# Patient Record
Sex: Female | Born: 2005 | Race: White | Hispanic: No | Marital: Single | Smoking: Never smoker
Health system: Southern US, Community
[De-identification: ages and names within clinical notes are randomized; demographics above are authoritative.]

## PROBLEM LIST (undated history)

## (undated) DIAGNOSIS — J45990 Exercise induced bronchospasm: Secondary | ICD-10-CM

## (undated) DIAGNOSIS — N83201 Unspecified ovarian cyst, right side: Secondary | ICD-10-CM

## (undated) DIAGNOSIS — N926 Irregular menstruation, unspecified: Secondary | ICD-10-CM

## (undated) HISTORY — DX: Unspecified ovarian cyst, right side: N83.201

## (undated) HISTORY — DX: Irregular menstruation, unspecified: N92.6

## (undated) HISTORY — PX: NO PAST SURGERIES: SHX2092

---
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
## 2023-01-19 ENCOUNTER — Ambulatory Visit (INDEPENDENT_AMBULATORY_CARE_PROVIDER_SITE_OTHER): Payer: Self-pay | Admitting: Clinical

## 2023-01-19 DIAGNOSIS — F411 Generalized anxiety disorder: Secondary | ICD-10-CM

## 2023-01-19 NOTE — Progress Notes (Addendum)
Time: 11:00 am- 11:58 am Diagnosis: F41.1 CPT Code: 16109U-04  Sabrina Harrington was seen remotely using secure video conferencing. They joined from their significant other's home at [address], [place], [place] and therapist was in her office.  Session focused on exploring Sabrina Harrington's perception that they are in survival mode. Therapist encouraged them to consider what it might look like to not be in survival mode, and suggested reflecting upon this, as well as incorporating opportunities for playfulness and enjoyment when possible. They are scheduled to be seen again in two weeks.  Treatment Plan Client Abilities/Strengths  Sabrina Harrington was able to insightfully describe emotional experiences.  Client Treatment Preferences  Sabrina Harrington is flexible in terms of appointment times, and prefers in person appointments.  Client Statement of Needs  Sabrina Harrington is seeking CBT to address symptoms of anxiety.  Treatment Level  Biweekly  Symptoms  Anxiety: panic attacks consisting of hyperventiliation, sensitivity to sound, clammy hands, tearfulness. Persistent difficulty relaxing across a variety of situations, tingling in limbs and spine (Status: maintained).  Problems Addressed  New Description  Goals 1. Sabrina Harrington's anxiety sometimes interferes with their ability to complete day-to-day tasks Objective Sabrina Harrington would like to develop strategies to regulate their anxiety  Target Date: 2023-12-30 Frequency: Biweekly  Progress: 0 Modality: individual  Related Interventions Therapist will incorporate manualized treatment as appropriate Therapist will provide referrals for additional resources as appropriate Therapist will provide opportunities to process experiences in session Therapist will help Sabrina Harrington to notice and disengage from maladaptive thoughts and behaviors Therapist will work with Sabrina Harrington to identify emotion regulation strategies including meditation, mindfulness, and self-care Diagnosis Axis none 300.02 (Generalized anxiety disorder) -  Open - [Signifier: n/a]    Conditions For Discharge Achievement of treatment goals and objectives   Chrissie Noa, PhD              Chrissie Noa, PhD

## 2023-03-01 ENCOUNTER — Ambulatory Visit: Payer: Self-pay | Admitting: Clinical

## 2023-03-22 ENCOUNTER — Ambulatory Visit: Payer: Self-pay | Admitting: Clinical

## 2023-05-04 ENCOUNTER — Ambulatory Visit
Admission: EM | Admit: 2023-05-04 | Discharge: 2023-05-04 | Disposition: A | Discharge status: Home / Self Care | Payer: Self-pay

## 2023-05-04 NOTE — ED Triage Notes (Signed)
Patient presents to UC for work note. States she missed work today due to cold like symptoms.

## 2023-06-12 IMAGING — US US EXTREM LOW*R* LIMITED
1 series · 10 of 10 positions shown · non-contrast
Comparison: None.

CLINICAL DATA: Initial evaluation for palpable mass at right
posterior calf for 1.5 years.

EXAM:
ULTRASOUND RIGHT LOWER EXTREMITY LIMITED
TECHNIQUE: Ultrasound examination of the lower extremity soft tissues was
performed in the area of clinical concern.

[Series 1: us soft tissue lower extremity limited right (non- · 10 acquisitions, 10 frames shown]
[im 1/10]
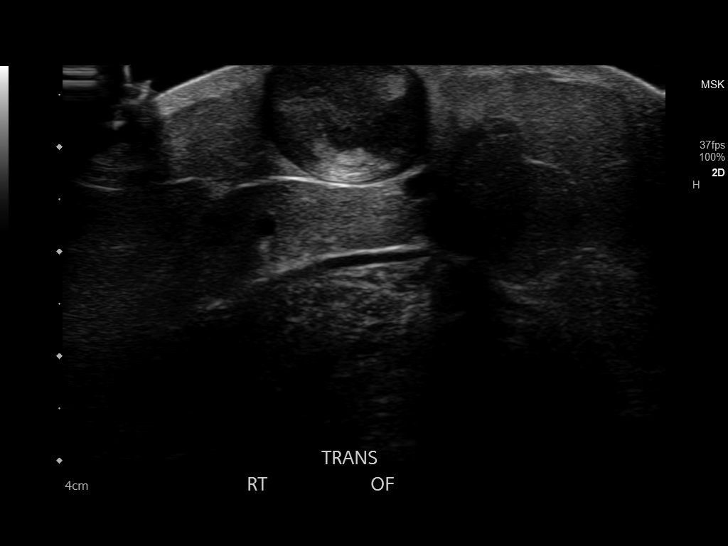
[im 2/10]
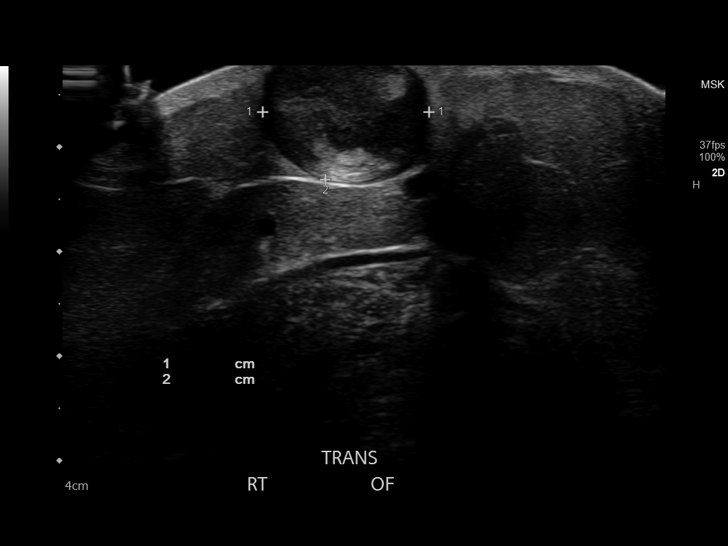
[im 3/10]
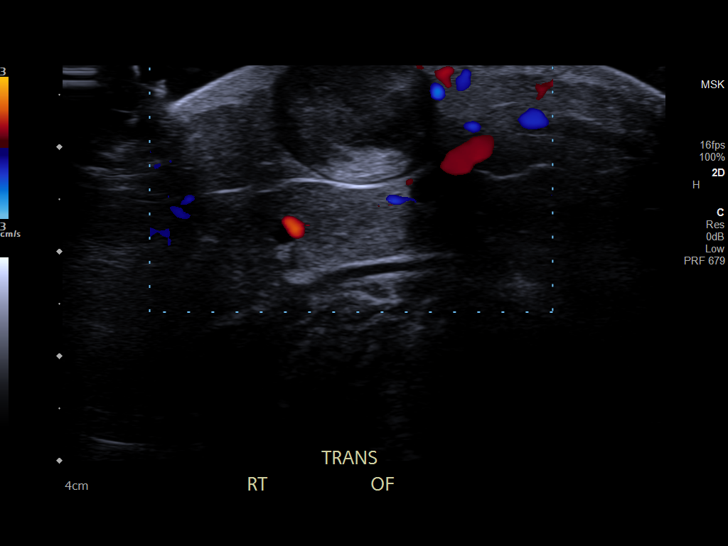
[im 4/10]
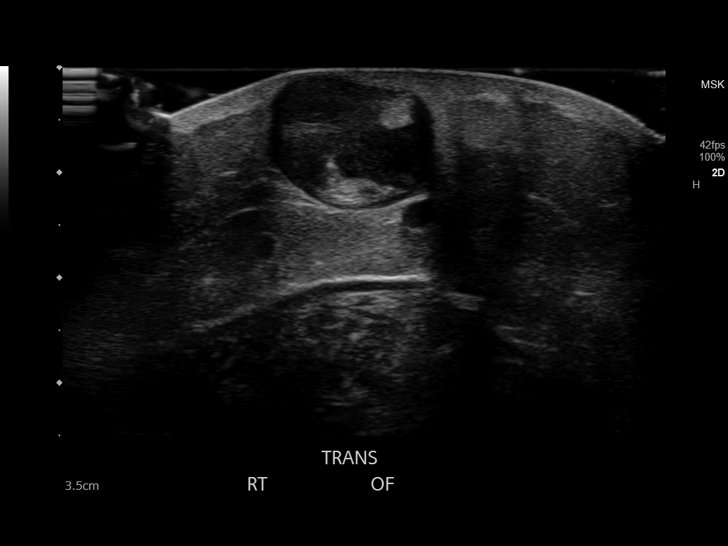
[im 5/10]
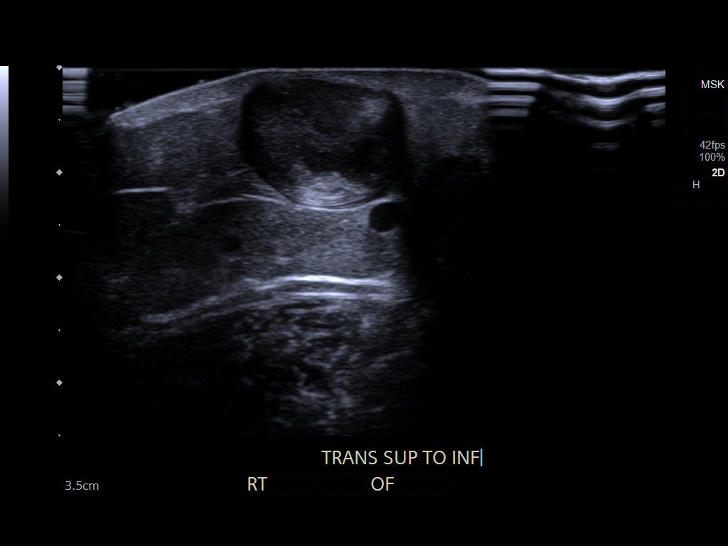
[im 6/10]
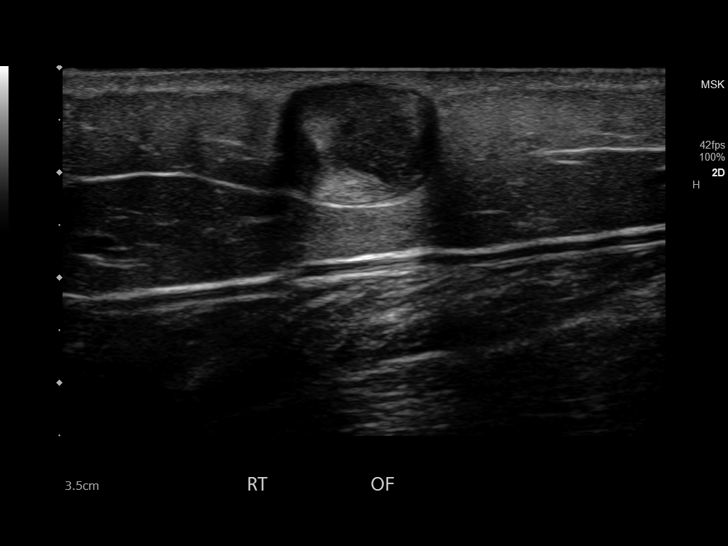
[im 7/10]
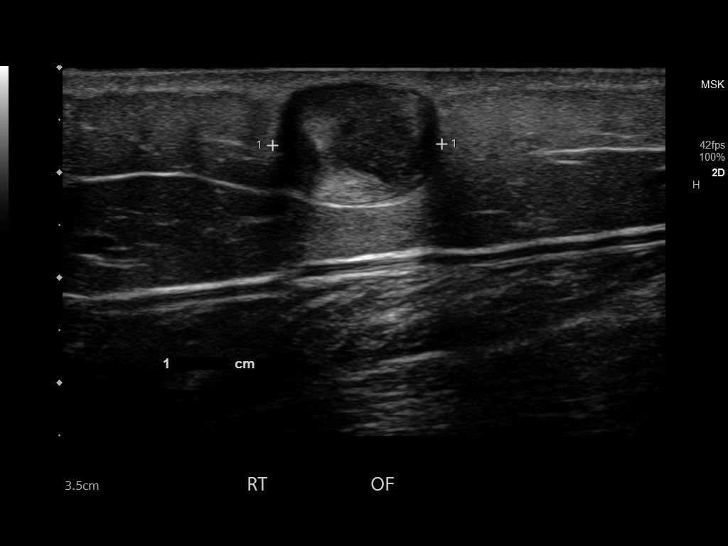
[im 8/10]
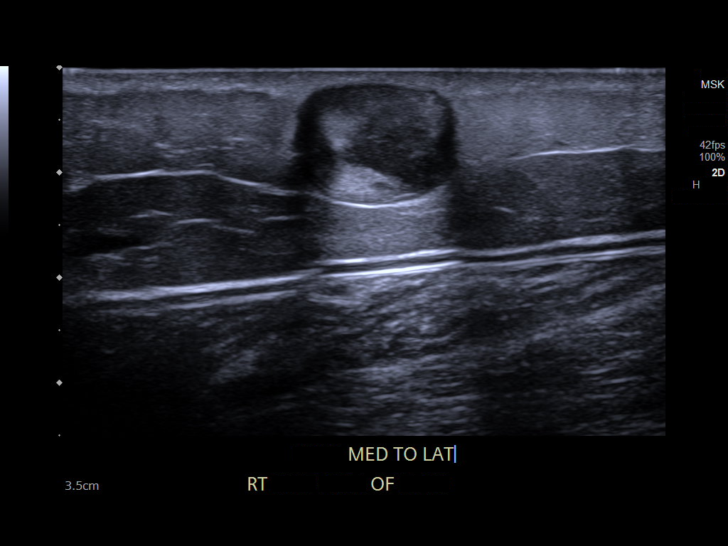
[im 9/10]
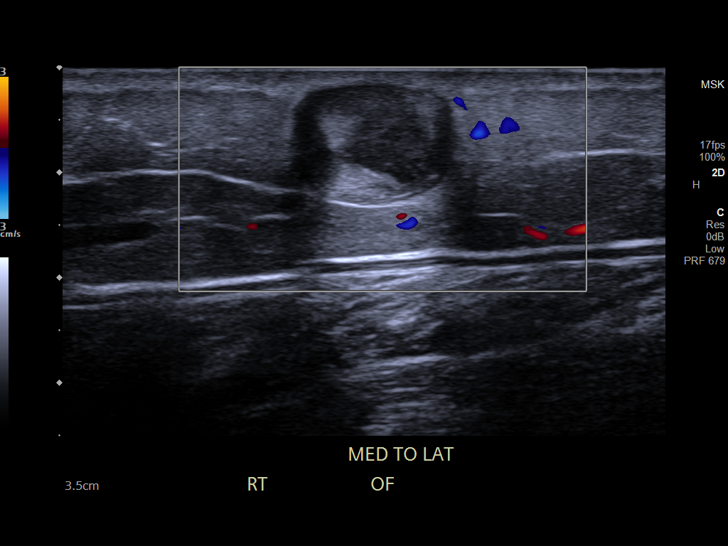
[im 10/10]
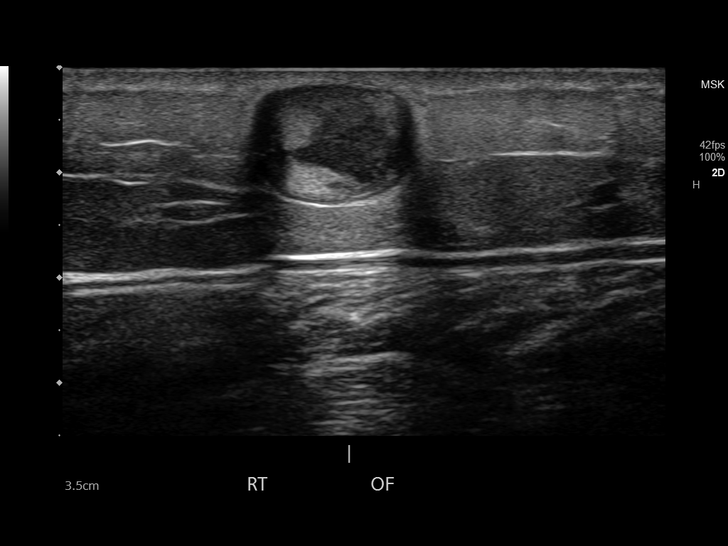

[10 of 10 positions shown; findings below may reference images not displayed]

FINDINGS: Targeted ultrasound of a palpable abnormality of concern at the
right posterior calf was performed. Ultrasound demonstrates a
well-circumscribed complex ovoid lesion measuring 1.6 x 1.2 x
cm. Lesion demonstrates mixed echogenicity but is predominantly
hypoechoic in nature. Abnormality is positioned just deep to the
skin, primarily involving the subcutaneous fat. Lesion mildly
compresses but does not appear to involve the underlying
musculature. No associated vascularity with Doppler interrogation.
Finding is indeterminate.

Remainder of the visualized surrounding fibromuscular soft tissues
are otherwise unremarkable.
IMPRESSION: 1.6 x 1.2 x 1.6 cm complex cystic lesion within the subcutaneous fat
of the right posterior calf. Finding demonstrates indeterminate
features by ultrasound, but could reflect a complex sebaceous cyst.
Ultrasound-guided FNA and histologic sampling would likely be
helpful for further evaluation. Alternatively, further evaluation
with dedicated MRI, with and without contrast, could be performed
for further evaluation as warranted.

## 2024-06-16 ENCOUNTER — Other Ambulatory Visit: Payer: Self-pay

## 2024-06-16 ENCOUNTER — Emergency Department
Admission: EM | Admit: 2024-06-16 | Discharge: 2024-06-16 | Disposition: A | Discharge status: Home / Self Care | Payer: Self-pay | Attending: Emergency Medicine | Admitting: Emergency Medicine

## 2024-06-16 DIAGNOSIS — N10 Acute tubulo-interstitial nephritis: Secondary | ICD-10-CM | POA: Insufficient documentation

## 2024-06-16 LAB — URINALYSIS, ROUTINE W REFLEX MICROSCOPIC
Bacteria, UA: NONE SEEN
Bilirubin Urine: NEGATIVE
Glucose, UA: NEGATIVE mg/dL
Hgb urine dipstick: NEGATIVE
Ketones, ur: NEGATIVE mg/dL
Nitrite: NEGATIVE
Protein, ur: 30 mg/dL — AB
Specific Gravity, Urine: 1.023 (ref 1.005–1.030)
WBC, UA: >50 WBC/hpf (ref 0–5)
pH: 6 (ref 5.0–8.0)

## 2024-06-16 LAB — CBC
HCT: 39.1 % (ref 36.0–46.0)
Hemoglobin: 12.5 g/dL (ref 12.0–15.0)
MCH: 27.8 pg (ref 26.0–34.0)
MCHC: 32 g/dL (ref 30.0–36.0)
MCV: 86.9 fL (ref 80.0–100.0)
Platelets: 277 K/uL (ref 150–400)
RBC: 4.5 MIL/uL (ref 3.87–5.11)
RDW: 12.5 % (ref 11.5–15.5)
WBC: 4.7 K/uL (ref 4.0–10.5)
nRBC: 0 % (ref 0.0–0.2)

## 2024-06-16 LAB — COMPREHENSIVE METABOLIC PANEL WITH GFR
ALT: 19 U/L (ref 0–44)
AST: 19 U/L (ref 15–41)
Albumin: 4.6 g/dL (ref 3.5–5.0)
Alkaline Phosphatase: 58 U/L (ref 38–126)
Anion gap: 10 (ref 5–15)
BUN: 14 mg/dL (ref 6–20)
CO2: 24 mmol/L (ref 22–32)
Calcium: 9.5 mg/dL (ref 8.9–10.3)
Chloride: 104 mmol/L (ref 98–111)
Creatinine, Ser: 0.73 mg/dL (ref 0.44–1.00)
GFR, Estimated: >60 mL/min (ref >60)
Glucose, Bld: 90 mg/dL (ref 70–99)
Potassium: 3.9 mmol/L (ref 3.5–5.1)
Sodium: 138 mmol/L (ref 135–145)
Total Bilirubin: 0.6 mg/dL (ref 0.0–1.2)
Total Protein: 7.9 g/dL (ref 6.5–8.1)

## 2024-06-16 LAB — POC URINE PREG, ED: Preg Test, Ur: NEGATIVE

## 2024-06-16 LAB — LIPASE, BLOOD: Lipase: 24 U/L (ref 11–51)

## 2024-06-16 MED ORDER — CEFDINIR 300 MG PO CAPS: 300.0000 mg | ORAL_CAPSULE | Freq: Two times a day (BID) | ORAL | 0 refills | Status: AC

## 2024-06-16 NOTE — Discharge Instructions (Addendum)
 You were seen in the emergency department today for evaluation of your urine symptoms and flank pain.  Your testing did show a likely urine infection with your flank pain this may be starting to affect your kidneys.  Your lab work was fortunately overall reassuring without evidence of sepsis.  I sent a prescription for antibiotics to your pharmacy.  Please take these as directed.  Follow-up your primary care doctor for further evaluation.  Return to the ER for new or worsening symptoms.

## 2024-06-16 NOTE — ED Triage Notes (Signed)
 Pt to ED via POV from home. Pt reports pain with urination and right lower back pain that started yesterday around 6pm. Pt denies hx of kidney stones.

## 2024-06-16 NOTE — ED Provider Notes (Signed)
 [facility] Provider Note    Event Date/Time   First MD Initiated Contact with Patient 06/16/24 1423     (approximate)   History   Back Pain   HPI  Misha Vanoverbeke is a 18 year old presenting to the emergency department for evaluation of dysuria and flank pain.  For the past several days patient has had dysuria and urinary frequency.  Has been using over-the-counter Azo.  Yesterday had onset of dull right sided lower back pain.  No sharp or intermittent pain.  No noted hematuria but patient is on their menstrual cycle.  Spoke with a friend who was concerned about a possible kidney infection and recommended presentation to the ER.  No fevers.     Physical Exam   Triage Vital Signs: ED Triage Vitals  Encounter Vitals Group     BP 06/16/24 1325 (!) 130/91     Girls Systolic BP Percentile --      Girls Diastolic BP Percentile --      Boys Systolic BP Percentile --      Boys Diastolic BP Percentile --      Pulse Rate 06/16/24 1325 74     Resp 06/16/24 1325 18     Temp 06/16/24 1325 98.4 F (36.9 C)     Temp Source 06/16/24 1325 Oral     SpO2 06/16/24 1325 98 %     Weight --      Height --      Head Circumference --      Peak Flow --      Pain Score 06/16/24 1324 2     Pain Loc --      Pain Education --      Exclude from Growth Chart --     Most recent vital signs: Vitals:   06/16/24 1429 06/16/24 1439  BP:    Pulse:    Resp:    Temp:    SpO2: 98% 98%     General: Awake, interactive  CV:  Good peripheral perfusion Resp:  Unlabored respirations Abd:  Nondistended, soft, no significant tenderness to palpation, no CVA tenderness Neuro:  Symmetric facial movement, fluid speech   ED Results / Procedures / Treatments   Labs (all labs ordered are listed, but only abnormal results are displayed) Labs Reviewed  URINALYSIS, ROUTINE W REFLEX MICROSCOPIC - Abnormal; Notable for the following components:      Result Value   Color, Urine  YELLOW (*)    APPearance CLOUDY (*)    Protein, ur 30 (*)    Leukocytes,Ua LARGE (*)    All other components within normal limits  URINE CULTURE  LIPASE, BLOOD  COMPREHENSIVE METABOLIC PANEL WITH GFR  CBC  POC URINE PREG, ED     EKG EKG independently reviewed and interpreted by myself demonstrates:    RADIOLOGY Imaging independently reviewed and interpreted by myself demonstrates:   Formal Radiology Read:  No results found.  PROCEDURES:  Critical Care performed: No  Procedures   MEDICATIONS ORDERED IN ED: Medications - No data to display   IMPRESSION / MDM / ASSESSMENT AND PLAN / ED COURSE  I reviewed the triage vital signs and the nursing notes.  Differential diagnosis includes, but is not limited to, UTI, pyelonephritis, much lower suspicion for renal stone based on clinical history, very low suspicion other acute intra-abdominal process given reassuring abdominal exam.  Patient's presentation is most consistent with acute complicated illness / injury requiring diagnostic workup.  18 year old presenting with dysuria  and flank pain.  Stable vitals on presentation.  Labs with reassuring CBC including normal white blood cell count, CMP including normal renal function, normal lipase.  UPT negative.  Urine is concerning for infection with greater than 50 white blood cells and large leukocyte esterase.  Small amount of red blood cells but no significant hematuria.  Clinical history and labs overall less suggestive of renal stone.  Did consider and discussed CT scan with patient to further evaluate, but they prefer to hold off on CT and empirically treat for UTI with possible early pyelonephritis without evidence of sepsis.  With low clinical suspicion for renal stone, do think this is reasonable.  Strict return precautions provided.  Patient discharged in stable condition with prescription for cefdinir.      FINAL CLINICAL IMPRESSION(S) / ED DIAGNOSES   Final diagnoses:   Acute pyelonephritis     Rx / DC Orders   ED Discharge Orders          Ordered    cefdinir (OMNICEF) 300 MG capsule  2 times daily        06/16/24 1438             Note:  This document was prepared using Dragon voice recognition software and may include unintentional dictation errors.   Levander Slate, MD 06/16/24 1440

## 2024-06-17 LAB — URINE CULTURE

## 2024-09-12 ENCOUNTER — Ambulatory Visit
Admission: EM | Admit: 2024-09-12 | Discharge: 2024-09-12 | Disposition: A | Discharge status: Home / Self Care | Payer: Self-pay | Attending: Emergency Medicine | Admitting: Emergency Medicine

## 2024-09-12 DIAGNOSIS — R300 Dysuria: Secondary | ICD-10-CM | POA: Insufficient documentation

## 2024-09-12 DIAGNOSIS — R112 Nausea with vomiting, unspecified: Secondary | ICD-10-CM | POA: Insufficient documentation

## 2024-09-12 LAB — POCT URINE DIPSTICK
Bilirubin, UA: NEGATIVE
Blood, UA: NEGATIVE
Glucose, UA: NEGATIVE mg/dL
Nitrite, UA: NEGATIVE
POC PROTEIN,UA: NEGATIVE
Spec Grav, UA: 1.025
Urobilinogen, UA: 2 U/dL — AB
pH, UA: 5.5

## 2024-09-12 LAB — POCT URINE PREGNANCY: Preg Test, Ur: NEGATIVE

## 2024-09-12 MED ORDER — CEPHALEXIN 500 MG PO CAPS: 500.0000 mg | ORAL_CAPSULE | Freq: Three times a day (TID) | ORAL | 0 refills | Status: AC

## 2024-09-12 MED ORDER — ONDANSETRON 4 MG PO TBDP: 4.0000 mg | ORAL_TABLET | Freq: Three times a day (TID) | ORAL | 0 refills | Status: AC | PRN

## 2024-09-12 NOTE — Discharge Instructions (Addendum)
 Take the antibiotic as directed.  The urine culture is pending.  We will call you if it shows the need to change or discontinue your antibiotic.    Take the antinausea medication as directed.  Keep yourself hydrated with clear liquids, such as water.  Advance your diet as tolerated.   Go to the emergency department if you have worsening symptoms.    Follow up with your primary care provider tomorrow.

## 2024-09-12 NOTE — ED Triage Notes (Signed)
 Vomiting, nausea, chills, congestion x 1 day. Urinary frequency, burning with urination x 4 days.

## 2024-09-12 NOTE — ED Notes (Signed)
 Attempted to call patient back 2 times no response. Placed back on wait list. Patient came back at 4:50pm

## 2024-09-12 NOTE — ED Provider Notes (Signed)
 " [facility]    CSN: [id number] Arrival date & time: 09/12/24  1453      History   Chief Complaint Chief Complaint  Patient presents with   Nausea   Emesis    HPI Sabrina Harrington is a 19 y.o. adult.   Patient presents with 1 day history of chills, congestion, nausea, vomiting.  No emesis today; 3 episodes yesterday.  Also presents with dysuria and urinary frequency x 4 days.  No fever, cough, shortness of breath, abdominal pain, hematuria, flank pain, vaginal discharge, pelvic pain.  No OTC medications taken today.  Patient has a history of acute pyelonephritis seen at [facility] on 06/16/2024; treated with cefdinir .  The history is provided by the patient and medical records.    Past Medical History:  Diagnosis Date   Bilateral ovarian cysts    Irregular periods/menstrual cycles     Patient Active Problem List   Diagnosis Date Noted   PCOS (polycystic ovarian syndrome) 09/19/2022   Nasal pain 09/19/2022   Preventative health care 10/20/2021   Mass of right lower extremity 10/20/2021   Anxiety 10/20/2021   Menstrual irregularity 10/20/2021   Marijuana smoker 10/20/2021    Past Surgical History:  Procedure Laterality Date   NO PAST SURGERIES      OB History   No obstetric history on file.      Home Medications    Prior to Admission medications  Medication Sig Start Date End Date Taking? Authorizing Provider  cephALEXin  (KEFLEX ) 500 MG capsule Take 1 capsule (500 mg total) by mouth 3 (three) times daily for 5 days. 09/12/24 09/17/24 Yes Corlis Burnard DEL, NP  ondansetron  (ZOFRAN -ODT) 4 MG disintegrating tablet Take 1 tablet (4 mg total) by mouth every 8 (eight) hours as needed for nausea or vomiting. 09/12/24  Yes Corlis Burnard DEL, NP  ibuprofen  (ADVIL ) 600 MG tablet Take 1 tablet (600 mg total) by mouth every 6 (six) hours as needed. Patient not taking: Reported on 09/19/2022 01/12/22   Van Knee, MD  meclizine  (ANTIVERT ) 25 MG tablet Take 1 tablet  (25 mg total) by mouth 3 (three) times daily as needed for dizziness. Patient not taking: Reported on 09/19/2022 01/12/22   Van Knee, MD    Family History Family History  Problem Relation Age of Onset   Hyperlipidemia Mother    Arthritis Mother    Hypertension Father    Diabetes Father    Hyperlipidemia Father    Cancer Maternal Aunt 27       breast cancer   Hypertension Maternal Grandfather    Diabetes Maternal Grandfather    Diabetes Paternal Grandfather     Social History Social History[1]   Allergies   Pineapple   Review of Systems Review of Systems  Constitutional:  Positive for chills. Negative for fever.  HENT:  Positive for congestion. Negative for ear pain and sore throat.   Respiratory:  Negative for cough and shortness of breath.   Gastrointestinal:  Positive for nausea and vomiting. Negative for abdominal pain and diarrhea.  Genitourinary:  Positive for dysuria and frequency. Negative for flank pain, hematuria, pelvic pain and vaginal discharge.     Physical Exam Triage Vital Signs ED Triage Vitals [09/12/24 1814]  Encounter Vitals Group     BP 118/80     Girls Systolic BP Percentile      Girls Diastolic BP Percentile      Boys Systolic BP Percentile      Boys Diastolic BP Percentile  Pulse Rate 69     Resp 18     Temp 98 F (36.7 C)     Temp src      SpO2 98 %     Weight      Height      Head Circumference      Peak Flow      Pain Score      Pain Loc      Pain Education      Exclude from Growth Chart    No data found.  Updated Vital Signs BP 118/80   Pulse 69   Temp 98 F (36.7 C)   Resp 18   LMP 08/20/2024 (Exact Date)   SpO2 98%   Visual Acuity Right Eye Distance:   Left Eye Distance:   Bilateral Distance:    Right Eye Near:   Left Eye Near:    Bilateral Near:     Physical Exam Constitutional:      General: Sabrina Harrington is not in acute distress. HENT:     Right Ear: Tympanic membrane normal.     Left Ear: Tympanic  membrane normal.     Nose: Nose normal.     Mouth/Throat:     Mouth: Mucous membranes are moist.     Pharynx: Oropharynx is clear.  Cardiovascular:     Rate and Rhythm: Normal rate and regular rhythm.     Heart sounds: Normal heart sounds.  Pulmonary:     Effort: Pulmonary effort is normal. No respiratory distress.     Breath sounds: Normal breath sounds.  Abdominal:     General: Bowel sounds are normal.     Palpations: Abdomen is soft.     Tenderness: There is no abdominal tenderness. There is no right CVA tenderness, left CVA tenderness, guarding or rebound.  Neurological:     Mental Status: Sabrina Harrington is alert.      UC Treatments / Results  Labs (all labs ordered are listed, but only abnormal results are displayed) Labs Reviewed  POCT URINE DIPSTICK - Abnormal; Notable for the following components:      Result Value   Ketones, POC UA trace (5) (*)    Urobilinogen, UA 2.0 (*)    Leukocytes, UA Small (1+) (*)    All other components within normal limits  URINE CULTURE  POCT URINE PREGNANCY    EKG   Radiology No results found.  Procedures Procedures (including critical care time)  Medications Ordered in UC Medications - No data to display  Initial Impression / Assessment and Plan / UC Course  I have reviewed the triage vital signs and the nursing notes.  Pertinent labs & imaging results that were available during my care of the patient were reviewed by me and considered in my medical decision making (see chart for details).    Dysuria, nausea and vomiting.  Afebrile and vital signs are stable.  Treating with Keflex . Urine culture pending. Discussed with patient that we will call if the urine culture shows the need to change or discontinue the antibiotic. Treating nausea and vomiting with Zofran .  Discussed clear liquid diet.  Instructed patient to advance diet as tolerated.  Discussed maintaining oral hydration at home; ED precautions discussed.  Education provided on  nausea and vomiting, dysuria.  Instructed patient to follow-up with PCP tomorrow.  Patient agrees to plan of care.      Final Clinical Impressions(s) / UC Diagnoses   Final diagnoses:  Dysuria  Nausea and vomiting, unspecified vomiting  type     Discharge Instructions      Take the antibiotic as directed.  The urine culture is pending.  We will call you if it shows the need to change or discontinue your antibiotic.    Take the antinausea medication as directed.  Keep yourself hydrated with clear liquids, such as water.  Advance your diet as tolerated.   Go to the emergency department if you have worsening symptoms.    Follow up with your primary care provider tomorrow.        ED Prescriptions     Medication Sig Dispense Auth. Provider   cephALEXin  (KEFLEX ) 500 MG capsule Take 1 capsule (500 mg total) by mouth 3 (three) times daily for 5 days. 15 capsule Corlis Burnard DEL, NP   ondansetron  (ZOFRAN -ODT) 4 MG disintegrating tablet Take 1 tablet (4 mg total) by mouth every 8 (eight) hours as needed for nausea or vomiting. 20 tablet Corlis Burnard DEL, NP      PDMP not reviewed this encounter.    [1]  Social History Tobacco Use   Smoking status: Never   Smokeless tobacco: Never  Vaping Use   Vaping status: Never Used  Substance Use Topics   Alcohol use: Never   Drug use: Yes    Frequency: 1.0 times per week    Types: Marijuana     Corlis Burnard DEL, NP 09/12/24 1851  "

## 2024-09-13 LAB — URINE CULTURE: Culture: NO GROWTH

## 2024-09-16 ENCOUNTER — Ambulatory Visit (HOSPITAL_COMMUNITY): Payer: Self-pay

## 2024-10-02 ENCOUNTER — Inpatient Hospital Stay: Admission: RE | Admit: 2024-10-02 | Discharge: 2024-10-02 | Payer: Self-pay

## 2024-10-02 VITALS — BP 116/76 | HR 106 | Temp 98.6°F | Resp 18

## 2024-10-02 DIAGNOSIS — J069 Acute upper respiratory infection, unspecified: Secondary | ICD-10-CM

## 2024-10-02 HISTORY — DX: Exercise induced bronchospasm: J45.990

## 2024-10-02 LAB — POCT URINE PREGNANCY: Preg Test, Ur: NEGATIVE

## 2024-10-02 MED ORDER — ALBUTEROL SULFATE HFA 108 (90 BASE) MCG/ACT IN AERS: 2.0000 | INHALATION_SPRAY | RESPIRATORY_TRACT | 0 refills | Status: AC | PRN

## 2024-10-02 MED ORDER — PREDNISONE 10 MG (21) PO TBPK: ORAL_TABLET | Freq: Every day | ORAL | 0 refills | Status: AC

## 2024-10-02 MED ORDER — AMOXICILLIN-POT CLAVULANATE 875-125 MG PO TABS: 1.0000 | ORAL_TABLET | Freq: Two times a day (BID) | ORAL | 0 refills | Status: AC

## 2024-10-02 MED ORDER — PROMETHAZINE-DM 6.25-15 MG/5ML PO SYRP: 5.0000 mL | ORAL_SOLUTION | Freq: Every evening | ORAL | 0 refills | Status: AC | PRN

## 2024-10-02 MED ORDER — BENZONATATE 100 MG PO CAPS: 100.0000 mg | ORAL_CAPSULE | Freq: Three times a day (TID) | ORAL | 0 refills | Status: AC

## 2024-10-02 NOTE — Discharge Instructions (Addendum)
 Begin Augmentin  twice daily for 7 days  Begin prednisone  every morning to help with shortness of breath and sinus pressure  You may use Tessalon  pill every 8 hours as needed for cough and cough syrup at bedtime to allow for rest  You may use inhaler as needed for shortness of breath    You can take Tylenol  as needed for fever reduction and pain relief.   For cough: honey 1/2 to 1 teaspoon (you can dilute the honey in water or another fluid).  You can also use guaifenesin and dextromethorphan for cough. You can use a humidifier for chest congestion and cough.  If you don't have a humidifier, you can sit in the bathroom with the hot shower running.      For sore throat: try warm salt water gargles, cepacol lozenges, throat spray, warm tea or water with lemon/honey, popsicles or ice, or OTC cold relief medicine for throat discomfort.   For congestion: take a daily anti-histamine like Zyrtec, Claritin, and a oral decongestant, such as pseudoephedrine.  You can also use Flonase 1-2 sprays in each nostril daily.   It is important to stay hydrated: drink plenty of fluids (water, gatorade/powerade/pedialyte, juices, or teas) to keep your throat moisturized and help further relieve irritation/discomfort.

## 2024-10-02 NOTE — ED Provider Notes (Signed)
 " [facility]    CSN: [id number] Arrival date & time: 10/02/24  1434      History   Chief Complaint Chief Complaint  Patient presents with   Cough    Wet Coughing, Sore Throat, Congestion, Fever, Hard to breathe at times(feels heavy) - Entered by patient   Sore Throat   Nasal Congestion   Generalized Body Aches    HPI Sabrina Harrington is a 19 y.o. adult.   Patient presents for evaluation of subjective fever, nasal congestion, sinus pressure to the left cheek, sore throat, productive cough and shortness of breath with exertion beginning 7 days ago.  No known sick contacts prior.  Has attempted use of cold and flu meds.  Tolerable to food and liquids but appetite is decreased.  Denies presence of wheezing or abdominal symptoms.      Past Medical History:  Diagnosis Date   Bilateral ovarian cysts    Irregular periods/menstrual cycles     Patient Active Problem List   Diagnosis Date Noted   PCOS (polycystic ovarian syndrome) 09/19/2022   Nasal pain 09/19/2022   Preventative health care 10/20/2021   Mass of right lower extremity 10/20/2021   Anxiety 10/20/2021   Menstrual irregularity 10/20/2021   Marijuana smoker 10/20/2021    Past Surgical History:  Procedure Laterality Date   NO PAST SURGERIES      OB History   No obstetric history on file.      Home Medications    Prior to Admission medications  Medication Sig Start Date End Date Taking? Authorizing Provider  ibuprofen  (ADVIL ) 600 MG tablet Take 1 tablet (600 mg total) by mouth every 6 (six) hours as needed. Patient not taking: Reported on 09/19/2022 01/12/22   Van Knee, MD  meclizine  (ANTIVERT ) 25 MG tablet Take 1 tablet (25 mg total) by mouth 3 (three) times daily as needed for dizziness. Patient not taking: Reported on 09/19/2022 01/12/22   Van Knee, MD  ondansetron  (ZOFRAN -ODT) 4 MG disintegrating tablet Take 1 tablet (4 mg total) by mouth every 8 (eight) hours as needed for  nausea or vomiting. 09/12/24   Corlis Burnard DEL, NP    Family History Family History  Problem Relation Age of Onset   Hyperlipidemia Mother    Arthritis Mother    Hypertension Father    Diabetes Father    Hyperlipidemia Father    Cancer Maternal Aunt 26       breast cancer   Hypertension Maternal Grandfather    Diabetes Maternal Grandfather    Diabetes Paternal Grandfather     Social History Social History[1]   Allergies   Pineapple   Review of Systems Review of Systems  Constitutional:  Positive for fever. Negative for activity change, appetite change, chills, diaphoresis, fatigue and unexpected weight change.  HENT:  Positive for congestion, sinus pressure and sore throat. Negative for dental problem, drooling, ear discharge, ear pain, facial swelling, hearing loss, mouth sores, nosebleeds, postnasal drip, rhinorrhea, sinus pain, sneezing, tinnitus, trouble swallowing and voice change.   Respiratory:  Positive for cough and shortness of breath. Negative for apnea, choking, chest tightness, wheezing and stridor.   Gastrointestinal: Negative.      Physical Exam Triage Vital Signs ED Triage Vitals  Encounter Vitals Group     BP 10/02/24 1447 116/76     Girls Systolic BP Percentile --      Girls Diastolic BP Percentile --      Boys Systolic BP Percentile --  Boys Diastolic BP Percentile --      Pulse Rate 10/02/24 1447 (!) 106     Resp 10/02/24 1447 18     Temp 10/02/24 1447 98.6 F (37 C)     Temp Source 10/02/24 1447 Oral     SpO2 10/02/24 1447 97 %     Weight --      Height --      Head Circumference --      Peak Flow --      Pain Score 10/02/24 1451 7     Pain Loc --      Pain Education --      Exclude from Growth Chart --    No data found.  Updated Vital Signs BP 116/76 (BP Location: Right Arm)   Pulse (!) 106   Temp 98.6 F (37 C) (Oral)   Resp 18   LMP 08/20/2024 (Exact Date)   SpO2 97%   Visual Acuity Right Eye Distance:   Left Eye  Distance:   Bilateral Distance:    Right Eye Near:   Left Eye Near:    Bilateral Near:     Physical Exam Constitutional:      Appearance: Normal appearance.  HENT:     Right Ear: Tympanic membrane and ear canal normal.     Left Ear: Tympanic membrane and ear canal normal.     Nose: Congestion present.     Right Sinus: No maxillary sinus tenderness or frontal sinus tenderness.     Left Sinus: Maxillary sinus tenderness present. No frontal sinus tenderness.     Mouth/Throat:     Pharynx: No oropharyngeal exudate or posterior oropharyngeal erythema.  Cardiovascular:     Rate and Rhythm: Normal rate and regular rhythm.     Pulses: Normal pulses.     Heart sounds: Normal heart sounds.  Pulmonary:     Effort: Pulmonary effort is normal.     Breath sounds: Normal breath sounds.  Neurological:     Mental Status: Sabrina Harrington is alert and oriented to person, place, and time. Mental status is at baseline.      UC Treatments / Results  Labs (all labs ordered are listed, but only abnormal results are displayed) Labs Reviewed  POCT URINE PREGNANCY - Normal    EKG   Radiology No results found.  Procedures Procedures (including critical care time)  Medications Ordered in UC Medications - No data to display  Initial Impression / Assessment and Plan / UC Course  I have reviewed the triage vital signs and the nursing notes.  Pertinent labs & imaging results that were available during my care of the patient were reviewed by me and considered in my medical decision making (see chart for details).  Acute URI  Patient is in no signs of distress nor toxic appearing.  Vital signs are stable.  Low suspicion for pneumonia, pneumothorax or bronchitis and therefore will defer imaging.  Viral testing deferred due to timeline.  Urine pregnancy completed as last known menstruation was 08/20/2024 and she endorses irregularity without use of birth control.  Testing negative.  Prescribed Augmentin  for  treatment as well as prednisone  and albuterol  for management of shortness of breath and Tessalon  and Promethazine  DM for management of cough. May use additional over-the-counter medications as needed for supportive care.  May follow-up with urgent care as needed if symptoms persist or worsen.   Final Clinical Impressions(s) / UC Diagnoses   Final diagnoses:  Acute URI   Discharge Instructions  None    ED Prescriptions   None    PDMP not reviewed this encounter.     [1]  Social History Tobacco Use   Smoking status: Never   Smokeless tobacco: Never  Vaping Use   Vaping status: Never Used  Substance Use Topics   Alcohol use: Never   Drug use: Yes    Frequency: 1.0 times per week    Types: Marijuana     Sabrina Shelba SAUNDERS, NP 10/02/24 1607  "

## 2024-10-02 NOTE — ED Triage Notes (Signed)
 Patient reports cough with clear mucus, sore throat, nasal congestion, headache and bodyaches x 7 days. Patient has been taking Cold and Flu and Tylenol   with mild relief. Rates sore throat 7/10 , headache pain 4/10 and bodyaches 6/10.
